# Patient Record
Sex: Female | Born: 2000
Health system: Southern US, Community
[De-identification: ages and names within clinical notes are randomized; demographics above are authoritative.]

## PROBLEM LIST (undated history)

## (undated) DIAGNOSIS — H6690 Otitis media, unspecified, unspecified ear: Secondary | ICD-10-CM

## (undated) DIAGNOSIS — G43909 Migraine, unspecified, not intractable, without status migrainosus: Secondary | ICD-10-CM

## (undated) DIAGNOSIS — F419 Anxiety disorder, unspecified: Secondary | ICD-10-CM

## (undated) HISTORY — PX: TYMPANOSTOMY TUBE PLACEMENT: SHX32

## (undated) HISTORY — DX: Migraine, unspecified, not intractable, without status migrainosus: G43.909

---
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
## 2020-01-27 NOTE — Progress Notes (Signed)
[facility]    Provider:  Dr Lucia Gaskins Requesting Provider: Morrell Riddle, PA-C Primary Care Provider:  Morrell Riddle, PA-C  CC:  Migraines  She is here with her mother, she had side effects to the Ajovy, mother provides most information, we discussed multiple options considering she is so sensitive to medications, we discussed Botox, we discussed Cefaly and some other devices, we discussed starting a little nortriptyline at night and transmagnetic stimulation.  We will start nortriptyline and transmagnetic stimulation.  HPI:  Teresa Burke is a 19 y.o. female here as requested by Teresa Burke, [name], PA-C for migraines.  She has a past medical history of ADD, autism, bipolar 2 disorder, generalized anxiety disorder, migraine. Mother here and provides a lot of information. She gets bad migraines every other day, it impacts her vision, she sees TV static, it has gotten a lot worse, started worsening at the start of this year when her grandfather passed away, hasn't gotten better. Starts on the left can spread, light and sound sensitivity, nausea, pulsating, pounding and throbbing. They can last up to 3 days, on average lasts more than 4 hours, sleeping helps. Loud noises can trigger. She used to get them only during menses, it has since progressed, advil works. At least 20 headache days a month, at least 10 are severe migraines. She does not ttolerate medications well. Father has migraines.She has vision changes, headaches can be in the morning, positional such as worse bending over or supine. No other focal neurologic deficits, associated symptoms, inciting events or modifiable factors. Mother provides most information.  Reviewed notes, labs and imaging from outside physicians, which showed:  Medications tried: Beta-blockers, gabapentin(possible side effects), Depakote (did not tolerate), cymbalta  I reviewed [name] notes.  Patient has vision changes, she sees static all  the time, she has an eye appointment next week, when she has a migraine he gets worse and then sometimes she gets spots but mostly after the headache is already started.  She gets these headaches 3 times a week, they last for 15 minutes to days, she will use Advil which helps with the pain but nothing helps with the vision.  The headache does not resolve but gets manageable after Advil.  Worse around her menses, more intense, last longer and vision is worse.  Sometimes she can go to sleep and wake up without them.  She gets nauseated, photophobia and phonophobia, she misses out on things due to these headaches.  She is had these headaches for life.  In middle school they got worse at menarche and they were definitely related to her cycles.  But they have gotten worse in the last year and now gets them at times not around her menses, some vision change with what she describes as imprint objects when she moves her visual fields she can still see the item she had just been looking at, the snow used to be still but now moves without eye movement, she wears glasses.  Cbc/cmp unremarkable  Review of Systems: Patient complains of symptoms per HPI as well as the following symptoms: headaches, vision changes. Pertinent negatives and positives per HPI. All others negative.   Social History   Socioeconomic History  . Marital status: Single    Spouse name: Not on file  . Number of children: Not on file  . Years of education: Not on file  . Highest education level: Not on file  Occupational History  . Occupation: Consulting civil engineer  Tobacco Use  .  Smoking status: Never Smoker  . Smokeless tobacco: Never Used  Substance and Sexual Activity  . Alcohol use: No  . Drug use: No  . Sexual activity: Never  Other Topics Concern  . Not on file  Social History Narrative   Lives with mom and step dad   Clinical biochemist   Caffeine: none   Social Determinants of Health   Financial Resource  Strain:   . Difficulty of Paying Living Expenses:   Food Insecurity:   . Worried About Charity fundraiser in the Last Year:   . Arboriculturist in the Last Year:   Transportation Needs:   . Film/video editor (Medical):   Marland Kitchen Lack of Transportation (Non-Medical):   Physical Activity:   . Days of Exercise per Week:   . Minutes of Exercise per Session:   Stress:   . Feeling of Stress :   Social Connections:   . Frequency of Communication with Friends and Family:   . Frequency of Social Gatherings with Friends and Family:   . Attends Religious Services:   . Active Member of Clubs or Organizations:   . Attends Archivist Meetings:   Marland Kitchen Marital Status:   Intimate Partner Violence:   . Fear of Current or Ex-Partner:   . Emotionally Abused:   Marland Kitchen Physically Abused:   . Sexually Abused:     Family History  Problem Relation Age of Onset  . Migraines Mother   . Depression Mother   . Anxiety disorder Mother   . Migraines Father   . Anxiety disorder Father   . Depression Father     Past Medical History:  Diagnosis Date  . Anxiety   . Migraine   . Otitis     Patient Active Problem List   Diagnosis Date Noted  . Autism 08/04/2017  . ADD (attention deficit disorder) 08/04/2017  . Visual disturbances 01/20/2015  . Migraine without aura and without status migrainosus, not intractable 01/20/2015  . Generalized anxiety disorder 01/20/2015  . Bipolar 2 disorder ([place]) 01/20/2015    Past Surgical History:  Procedure Laterality Date  . TYMPANOSTOMY TUBE PLACEMENT      Current Outpatient Medications  Medication Sig Dispense Refill  . loratadine (CLARITIN) 10 MG tablet Take 10 mg by mouth daily.    Marland Kitchen VITAMIN D PO Take 5,000 Int'l Units by mouth daily.    . nortriptyline (PAMELOR) 10 MG capsule Take 1 capsule (10 mg total) by mouth at bedtime. 30 capsule 3   No current facility-administered medications for this visit.    Allergies as of 01/29/2020 - Review Complete  01/29/2020  Allergen Reaction Noted  . Mosquito (culex pipiens) allergy skin test Anaphylaxis 11/28/2019  . Fremanezumab-vfrm Hives 11/25/2019  . Other  01/20/2015    Vitals: BP 133/86 (BP Location: Right Arm, Patient Position: Sitting) Comment: pt nervous  Pulse 99   Temp (!) 97.5 F (36.4 C) Comment: taken at front; mother 97.4  Ht 5\' 3"  (1.6 m)   Wt 109 lb (49.4 kg)   BMI 19.31 kg/m  Last Weight:  Wt Readings from Last 1 Encounters:  01/29/20 109 lb (49.4 kg) (16 %, Z= -0.98)*   * Growth percentiles are based on CDC (Girls, 2-20 Years) data.   Last Height:   Ht Readings from Last 1 Encounters:  01/29/20 5\' 3"  (1.6 m) (31 %, Z= -0.49)*   * Growth percentiles are based on CDC (Girls, 2-20 Years) data.  Physical exam: Exam: Gen: flat affect, conversant, well nourised               CV: RRR, no MRG. No Carotid Bruits. No peripheral edema, warm, nontender Eyes: Conjunctivae clear without exudates or hemorrhage  Neuro: Detailed Neurologic Exam  Speech:    Speech is normal; fluent and spontaneous with normal comprehension.  Cognition:    The patient is oriented to person, place, and time;     recent and remote memory intact;   Cranial Nerves:    The pupils are equal, round, and reactive to light.Visual fields are full to finger confrontation. Extraocular movements are intact. Trigeminal sensation is intact and the muscles of mastication are normal. The face is symmetric. The palate elevates in the midline. Hearing intact. Voice is normal. Shoulder shrug is normal. The tongue has normal motion without fasciculations.  Gait:    Normal native gait  Motor Observation:    No asymmetry, no atrophy, and no involuntary movements noted. Tone:    Normal muscle tone.    Posture:    Posture is normal. normal erect    Strength:    Strength is V/V in the upper and lower limbs.       Assessment/Plan: 19 year old with chronic migraines, autistic, mother provides most  information. She is extremely sensitive to medications, Ajovy helped but she had side effects.  - Nortriptyline 10mg  at bedtime:  - TMS [facility] on [address] for refractory depression and chronic migraines: we will call and see what the referral protocol process entails - If Nortriptyline has side effects, try imipramine - Could also try topamax 25mg  and cut in half - MRI brain was normal   No orders of the defined types were placed in this encounter.  Meds ordered this encounter  Medications  . nortriptyline (PAMELOR) 10 MG capsule    Sig: Take 1 capsule (10 mg total) by mouth at bedtime.    Dispense:  30 capsule    Refill:  3    Cc:  , PA-C  , MD  [facility] [address] [place], [address] [place]  Phone [phone] Fax [phone]  I spent 30 minutes of face-to-face and non-face-to-face time with patient on the  1. Chronic migraine w/o aura w/o status migrainosus, not intractable    diagnosis.  This included previsit chart review, lab review, study review, order entry, electronic health record documentation, patient education on the different diagnostic and therapeutic options, counseling and coordination of care, risks and benefits of management, compliance, or risk factor reduction

## 2020-01-29 ENCOUNTER — Other Ambulatory Visit: Payer: Self-pay

## 2020-01-29 ENCOUNTER — Encounter: Payer: Self-pay | Admitting: Neurology

## 2020-01-29 ENCOUNTER — Ambulatory Visit (INDEPENDENT_AMBULATORY_CARE_PROVIDER_SITE_OTHER): Payer: BC Managed Care – PPO | Admitting: Neurology

## 2020-01-29 VITALS — BP 133/86 | HR 99 | Temp 97.5°F | Ht 63.0 in | Wt 109.0 lb

## 2020-01-29 DIAGNOSIS — G43709 Chronic migraine without aura, not intractable, without status migrainosus: Secondary | ICD-10-CM

## 2020-01-29 MED ORDER — NORTRIPTYLINE HCL 10 MG PO CAPS: 10.0000 mg | ORAL_CAPSULE | Freq: Every day | ORAL | 3 refills | Status: DC

## 2020-01-29 NOTE — Patient Instructions (Addendum)
Start Nortriptyline at bedtime for prevention TMS therapy - will call then Maxalt/Rizatriptan: Please take one tablet at the onset of your headache. If it does not improve the symptoms please take one additional tablet. Do not take more then 2 tablets in 24hrs. Do not take use more then 2 to 3 times in a week.  Rizatriptan disintegrating tablets What is this medicine? RIZATRIPTAN (rye za TRIP tan) is used to treat migraines with or without aura. An aura is a strange feeling or visual disturbance that warns you of an attack. It is not used to prevent migraines. This medicine may be used for other purposes; ask your health care provider or pharmacist if you have questions. COMMON BRAND NAME(S): Maxalt-MLT What should I tell my health care provider before I take this medicine? They need to know if you have any of these conditions:  cigarette smoker  circulation problems in fingers and toes  diabetes  heart disease  high blood pressure  high cholesterol  history of irregular heartbeat  history of stroke  kidney disease  liver disease  stomach or intestine problems  an unusual or allergic reaction to rizatriptan, other medicines, foods, dyes, or preservatives  pregnant or trying to get pregnant  breast-feeding How should I use this medicine? Take this medicine by mouth. Follow the directions on the prescription label. Leave the tablet in the sealed blister pack until you are ready to take it. With dry hands, open the blister and gently remove the tablet. If the tablet breaks or crumbles, throw it away and take a new tablet out of the blister pack. Place the tablet in the mouth and allow it to dissolve, and then swallow. Do not cut, crush, or chew this medicine. You do not need water to take this medicine. Do not take it more often than directed. Talk to your pediatrician regarding the use of this medicine in children. While this drug may be prescribed for children as young as 6  years for selected conditions, precautions do apply. Overdosage: If you think you have taken too much of this medicine contact a poison control center or emergency room at once. NOTE: This medicine is only for you. Do not share this medicine with others. What if I miss a dose? This does not apply. This medicine is not for regular use. What may interact with this medicine? Do not take this medicine with any of the following medicines:  certain medicines for migraine headache like almotriptan, eletriptan, frovatriptan, naratriptan, rizatriptan, sumatriptan, zolmitriptan  ergot alkaloids like dihydroergotamine, ergonovine, ergotamine, methylergonovine  MAOIs like Carbex, Eldepryl, Marplan, Nardil, and Parnate This medicine may also interact with the following medications:  certain medicines for depression, anxiety, or psychotic disorders  propranolol This list may not describe all possible interactions. Give your health care provider a list of all the medicines, herbs, non-prescription drugs, or dietary supplements you use. Also tell them if you smoke, drink alcohol, or use illegal drugs. Some items may interact with your medicine. What should I watch for while using this medicine? Visit your healthcare professional for regular checks on your progress. Tell your healthcare professional if your symptoms do not start to get better or if they get worse. You may get drowsy or dizzy. Do not drive, use machinery, or do anything that needs mental alertness until you know how this medicine affects you. Do not stand up or sit up quickly, especially if you are an older patient. This reduces the risk of dizzy or fainting  spells. Alcohol may interfere with the effect of this medicine. Your mouth may get dry. Chewing sugarless gum or sucking hard candy and drinking plenty of water may help. Contact your healthcare professional if the problem does not go away or is severe. If you take migraine medicines for 10  or more days a month, your migraines may get worse. Keep a diary of headache days and medicine use. Contact your healthcare professional if your migraine attacks occur more frequently. What side effects may I notice from receiving this medicine? Side effects that you should report to your doctor or health care professional as soon as possible:  allergic reactions like skin rash, itching or hives, swelling of the face, lips, or tongue  chest pain or chest tightness  signs and symptoms of a dangerous change in heartbeat or heart rhythm like chest pain; dizziness; fast, irregular heartbeat; palpitations; feeling faint or lightheaded; falls; breathing problems  signs and symptoms of a stroke like changes in vision; confusion; trouble speaking or understanding; severe headaches; sudden numbness or weakness of the face, arm or leg; trouble walking; dizziness; loss of balance or coordination  signs and symptoms of serotonin syndrome like irritable; confusion; diarrhea; fast or irregular heartbeat; muscle twitching; stiff muscles; trouble walking; sweating; high fever; seizures; chills; vomiting Side effects that usually do not require medical attention (report to your doctor or health care professional if they continue or are bothersome):  diarrhea  dizziness  drowsiness  dry mouth  headache  nausea, vomiting  pain, tingling, numbness in the hands or feet  stomach pain This list may not describe all possible side effects. Call your doctor for medical advice about side effects. You may report side effects to FDA at 1-800-FDA-1088. Where should I keep my medicine? Keep out of the reach of children. Store at room temperature between 15 and 30 degrees C (59 and 86 degrees F). Protect from light and moisture. Throw away any unused medicine after the expiration date. NOTE: This sheet is a summary. It may not cover all possible information. If you have questions about this medicine, talk to your  doctor, pharmacist, or health care provider.  2020 Elsevier/Gold Standard (2018-05-07 14:58:08)   Nortriptyline capsules What is this medicine? NORTRIPTYLINE (nor TRIP ti leen) is used to treat depression  This medicine may be used for other purposes; ask your health care provider or pharmacist if you have questions. COMMON BRAND NAME(S): Aventyl, Pamelor What should I tell my health care provider before I take this medicine? They need to know if you have any of these conditions:  bipolar disorder  Brugada syndrome  difficulty passing urine  glaucoma  heart disease  if you drink alcohol  liver disease  schizophrenia  seizures  suicidal thoughts, plans or attempt; a previous suicide attempt by you or a family member  thyroid disease  an unusual or allergic reaction to nortriptyline, other tricyclic antidepressants, other medicines, foods, dyes, or preservatives  pregnant or trying to get pregnant  breast-feeding How should I use this medicine? Take this medicine by mouth with a glass of water. Follow the directions on the prescription label. Take your doses at regular intervals. Do not take it more often than directed. Do not stop taking this medicine suddenly except upon the advice of your doctor. Stopping this medicine too quickly may cause serious side effects or your condition may worsen. A special MedGuide will be given to you by the pharmacist with each prescription and refill. Be sure to read  this information carefully each time. Talk to your pediatrician regarding the use of this medicine in children. Special care may be needed. Overdosage: If you think you have taken too much of this medicine contact a poison control center or emergency room at once. NOTE: This medicine is only for you. Do not share this medicine with others. What if I miss a dose? If you miss a dose, take it as soon as you can. If it is almost time for your next dose, take only that dose. Do  not take double or extra doses. What may interact with this medicine? Do not take this medicine with any of the following medications:  cisapride  dronedarone  linezolid  MAOIs like Carbex, Eldepryl, Marplan, Nardil, and Parnate  methylene blue (injected into a vein)  pimozide  thioridazine This medicine may also interact with the following medications:  alcohol  antihistamines for allergy, cough, and cold  atropine  certain medicines for bladder problems like oxybutynin, tolterodine  certain medicines for depression like amitriptyline, fluoxetine, sertraline  certain medicines for Parkinson's disease like benztropine, trihexyphenidyl  certain medicines for stomach problems like dicyclomine, hyoscyamine  certain medicines for travel sickness like scopolamine  chlorpropamide  cimetidine  ipratropium  other medicines that prolong the QT interval (an abnormal heart rhythm) like dofetilide  other medicines that can cause serotonin syndrome like St. John's Wort, fentanyl, lithium, tramadol, tryptophan, buspirone, and some medicines for headaches like sumatriptan or rizatriptan  quinidine  reserpine  thyroid medicine This list may not describe all possible interactions. Give your health care provider a list of all the medicines, herbs, non-prescription drugs, or dietary supplements you use. Also tell them if you smoke, drink alcohol, or use illegal drugs. Some items may interact with your medicine. What should I watch for while using this medicine? Tell your doctor if your symptoms do not get better or if they get worse. Visit your doctor or health care professional for regular checks on your progress. Because it may take several weeks to see the full effects of this medicine, it is important to continue your treatment as prescribed by your doctor. Patients and their families should watch out for new or worsening thoughts of suicide or depression. Also watch out for  sudden changes in feelings such as feeling anxious, agitated, panicky, irritable, hostile, aggressive, impulsive, severely restless, overly excited and hyperactive, or not being able to sleep. If this happens, especially at the beginning of treatment or after a change in dose, call your health care professional. Bonita Quin may get drowsy or dizzy. Do not drive, use machinery, or do anything that needs mental alertness until you know how this medicine affects you. Do not stand or sit up quickly, especially if you are an older patient. This reduces the risk of dizzy or fainting spells. Alcohol may interfere with the effect of this medicine. Avoid alcoholic drinks. Do not treat yourself for coughs, colds, or allergies without asking your doctor or health care professional for advice. Some ingredients can increase possible side effects. Your mouth may get dry. Chewing sugarless gum or sucking hard candy, and drinking plenty of water may help. Contact your doctor if the problem does not go away or is severe. This medicine may cause dry eyes and blurred vision. If you wear contact lenses you may feel some discomfort. Lubricating drops may help. See your eye doctor if the problem does not go away or is severe. This medicine can cause constipation. Try to have a bowel movement  at least every 2 to 3 days. If you do not have a bowel movement for 3 days, call your doctor or health care professional. This medicine can make you more sensitive to the sun. Keep out of the sun. If you cannot avoid being in the sun, wear protective clothing and use sunscreen. Do not use sun lamps or tanning beds/booths. What side effects may I notice from receiving this medicine? Side effects that you should report to your doctor or health care professional as soon as possible:  allergic reactions like skin rash, itching or hives, swelling of the face, lips, or tongue  anxious  breathing problems  changes in vision  confusion  elevated  mood, decreased need for sleep, racing thoughts, impulsive behavior  eye pain  fast, irregular heartbeat  feeling faint or lightheaded, falls  feeling agitated, angry, or irritable  fever with increased sweating  hallucination, loss of contact with reality  seizures  stiff muscles  suicidal thoughts or other mood changes  tingling, pain, or numbness in the feet or hands  trouble passing urine or change in the amount of urine  trouble sleeping  unusually weak or tired  vomiting  yellowing of the eyes or skin Side effects that usually do not require medical attention (report to your doctor or health care professional if they continue or are bothersome):  change in sex drive or performance  change in appetite or weight  constipation  dizziness  dry mouth  nausea  tired  tremors  upset stomach This list may not describe all possible side effects. Call your doctor for medical advice about side effects. You may report side effects to FDA at 1-800-FDA-1088. Where should I keep my medicine? Keep out of the reach of children. Store at room temperature between 15 and 30 degrees C (59 and 86 degrees F). Keep container tightly closed. Throw away any unused medicine after the expiration date. NOTE: This sheet is a summary. It may not cover all possible information. If you have questions about this medicine, talk to your doctor, pharmacist, or health care provider.  2020 Elsevier/Gold Standard (2018-10-15 13:24:58)

## 2020-02-02 ENCOUNTER — Telehealth: Payer: Self-pay | Admitting: Neurology

## 2020-02-02 NOTE — Telephone Encounter (Signed)
Teresa Burke, I would like to send patient to Trans magnetic therapy(TMS) at [facility] on [address] for refractory depression and chronic migraines. Would you give them a call please and see what the process for referral is? Thank you !!

## 2020-02-04 NOTE — Telephone Encounter (Signed)
I have contacted office on [place], I would like to send patient to Trans magnetic therapy(TMS) at [facility] on [address] for refractory depression and chronic migraines .  There office relayed to me I had to fill out referral on line and they would call the patient. Process has been completed.

## 2020-02-06 MED ORDER — RIZATRIPTAN BENZOATE 10 MG PO TBDP: 10.0000 mg | ORAL_TABLET | ORAL | 11 refills | Status: DC | PRN

## 2020-02-06 NOTE — Telephone Encounter (Signed)
Meds ordered this encounter  Medications  . rizatriptan (MAXALT-MLT) 10 MG disintegrating tablet    Sig: Take 1 tablet (10 mg total) by mouth as needed for migraine. May repeat in 2 hours if needed    Dispense:  9 tablet    Refill:  11    Suanne Marker, MD 02/06/2020, 12:44 PM Certified in Neurology, Neurophysiology and Neuroimaging  [facility] [address], [address] [place], [place] [address] [phone]

## 2020-02-21 ENCOUNTER — Other Ambulatory Visit: Payer: Self-pay | Admitting: Neurology

## 2020-05-05 ENCOUNTER — Telehealth: Payer: BC Managed Care – PPO | Admitting: Neurology

## 2020-07-04 NOTE — Progress Notes (Signed)
[facility]    Provider:  Dr Teresa Burke Requesting Provider: Morrell Riddle, Burke Primary Care Provider:  Morrell Riddle, Burke  CC:  Migraines  Virtual Visit via Video Note  I connected with Teresa Burke on 07/05/20 and her mother at  1:00 PM EDT by a video enabled telemedicine application and verified that I am speaking with the correct person using two identifiers.  Location: Patient: Home Provider: office   I discussed the limitations of evaluation and management by telemedicine and the availability of in person appointments. The patient expressed understanding and agreed to proceed.  Follow Up Instructions:    I discussed the assessment and treatment plan with the patient. The patient was provided an opportunity to ask questions and all were answered. The patient agreed with the plan and demonstrated an understanding of the instructions.   The patient was advised to call back or seek an in-person evaluation if the symptoms worsen or if the condition fails to improve as anticipated.  I provided more than 25 minutes of video face-to-face and non-face-to-face time during this encounter.   Teresa Fret, MD    Interval history July 04, 2020: Patient is here for follow-up, usually here with her mother, she had side effects to Ajovy, we tried her on Topamax, TMS therapy and nortriptyline.  She was also given rizatriptan which she found very helpful.  However she was unable to continue taking nortriptyline due to side effects and she was receiving TMS treatment at [facility].  She seems to have side effects to every medication we try.  May consider Cefaly or biofeedback.  Since the rizatriptan help maybe she can use that acutely.  She finished the TMS and started feeling better then started having a lot of physical issues and stomach issues and did reduce her migraines. Mother is on the call. They said they wanted to wait until she had her physical  issues addressed. But they are waiting to see if she was improved, migraines a lot less frequent, she is having 8 migraine/headache days and improved in severity, most of the pain is headaches. Mother says she is feeling "bad physically" but headaches/migraines are better. They are mild. Rizatriptan did not help. Will try Naratriptan.   02/2020: She is here with her mother, she had side effects to the Ajovy, mother provides most information, we discussed multiple options considering she is so sensitive to medications, we discussed Botox, we discussed Cefaly and some other devices, we discussed starting a little nortriptyline at night and transmagnetic stimulation.  We will start nortriptyline and transmagnetic stimulation.  HPI:  Teresa Burke is a 19 y.o. female here as requested by Teresa Burke for migraines.  She has a past medical history of ADD, autism, bipolar 2 disorder, generalized anxiety disorder, migraine. Mother here and provides a lot of information. She gets bad migraines every other day, it impacts her vision, she sees TV static, it has gotten a lot worse, started worsening at the start of this year when her grandfather passed away, hasn't gotten better. Starts on the left can spread, light and sound sensitivity, nausea, pulsating, pounding and throbbing. They can last up to 3 days, on average lasts more than 4 hours, sleeping helps. Loud noises can trigger. She used to get them only during menses, it has since progressed, advil works. At least 20 headache days a month, at least 10 are severe migraines. She does not ttolerate medications well. Father has migraines.She  has vision changes, headaches can be in the morning, positional such as worse bending over or supine. No other focal neurologic deficits, associated symptoms, inciting events or modifiable factors. Mother provides most information.  Reviewed notes, labs and imaging from outside physicians, which showed:  Medications  tried: Beta-blockers, gabapentin(possible side effects), Depakote (did not tolerate), cymbalta  I reviewed Teresa Burke's notes.  Patient has vision changes, she sees static all the time, she has an eye appointment next week, when she has a migraine he gets worse and then sometimes she gets spots but mostly after the headache is already started.  She gets these headaches 3 times a week, they last for 15 minutes to days, she will use Advil which helps with the pain but nothing helps with the vision.  The headache does not resolve but gets manageable after Advil.  Worse around her menses, more intense, last longer and vision is worse.  Sometimes she can go to sleep and wake up without them.  She gets nauseated, photophobia and phonophobia, she misses out on things due to these headaches.  She is had these headaches for life.  In middle school they got worse at menarche and they were definitely related to her cycles.  But they have gotten worse in the last year and now gets them at times not around her menses, some vision change with what she describes as imprint objects when she moves her visual fields she can still see the item she had just been looking at, the snow used to be still but now moves without eye movement, she wears glasses.  Cbc/cmp unremarkable  Review of Systems: Patient complains of symptoms per HPI as well as the following symptoms: headaches, abdominal pain. Pertinent negatives and positives per HPI. All others negative.   Social History   Socioeconomic History  . Marital status: Single    Spouse name: Not on file  . Number of children: Not on file  . Years of education: Not on file  . Highest education level: Not on file  Occupational History  . Occupation: Consulting civil engineerstudent  Tobacco Use  . Smoking status: Never Smoker  . Smokeless tobacco: Never Used  Vaping Use  . Vaping Use: Never used  Substance and Sexual Activity  . Alcohol use: No  . Drug use: No  . Sexual activity: Never   Other Topics Concern  . Not on file  Social History Narrative   Lives with mom and step dad   [employer]   Private tutor   Caffeine: none   Social Determinants of Health   Financial Resource Strain:   . Difficulty of Paying Living Expenses: Not on file  Food Insecurity:   . Worried About Programme researcher, broadcasting/film/videounning Out of Food in the Last Year: Not on file  . Ran Out of Food in the Last Year: Not on file  Transportation Needs:   . Lack of Transportation (Medical): Not on file  . Lack of Transportation (Non-Medical): Not on file  Physical Activity:   . Days of Exercise per Week: Not on file  . Minutes of Exercise per Session: Not on file  Stress:   . Feeling of Stress : Not on file  Social Connections:   . Frequency of Communication with Friends and Family: Not on file  . Frequency of Social Gatherings with Friends and Family: Not on file  . Attends Religious Services: Not on file  . Active Member of Clubs or Organizations: Not on file  . Attends Club  or Organization Meetings: Not on file  . Marital Status: Not on file  Intimate Partner Violence:   . Fear of Current or Ex-Partner: Not on file  . Emotionally Abused: Not on file  . Physically Abused: Not on file  . Sexually Abused: Not on file    Family History  Problem Relation Age of Onset  . Migraines Mother   . Depression Mother   . Anxiety disorder Mother   . Migraines Father   . Anxiety disorder Father   . Depression Father     Past Medical History:  Diagnosis Date  . Anxiety   . Migraine   . Otitis     Patient Active Problem List   Diagnosis Date Noted  . Autism 08/04/2017  . ADD (attention deficit disorder) 08/04/2017  . Visual disturbances 01/20/2015  . Migraine without aura and without status migrainosus, not intractable 01/20/2015  . Generalized anxiety disorder 01/20/2015  . Bipolar 2 disorder (HCC) 01/20/2015    Past Surgical History:  Procedure Laterality Date  . TYMPANOSTOMY TUBE PLACEMENT       Current Outpatient Medications  Medication Sig Dispense Refill  . loratadine (CLARITIN) 10 MG tablet Take 10 mg by mouth daily.    . naratriptan (AMERGE) 2.5 MG tablet Take 1 tablet (2.5 mg total) by mouth as needed for migraine. Take one (1) tablet at onset of headache; if returns or does not resolve, may repeat after 4 hours; do not exceed five (5) mg in 24 hours. 9 tablet 6  . nortriptyline (PAMELOR) 10 MG capsule TAKE 1 CAPSULE (10 MG TOTAL) BY MOUTH AT BEDTIME. 90 capsule 2  . VITAMIN D PO Take 5,000 Int'l Units by mouth daily.     No current facility-administered medications for this visit.    Allergies as of 07/05/2020 - Review Complete 01/29/2020  Allergen Reaction Noted  . Mosquito (culex pipiens) allergy skin test Anaphylaxis 11/28/2019  . Fremanezumab-vfrm Hives 11/25/2019  . Other  01/20/2015    Vitals: There were no vitals taken for this visit. Last Weight:  Wt Readings from Last 1 Encounters:  01/29/20 109 lb (49.4 kg) (16 %, Z= -0.98)*   * Growth percentiles are based on CDC (Girls, 2-20 Years) data.   Last Height:   Ht Readings from Last 1 Encounters:  01/29/20 5\' 3"  (1.6 m) (31 %, Z= -0.49)*   * Growth percentiles are based on CDC (Girls, 2-20 Years) data.    Physical exam: Exam:    Physical exam: Exam: Gen: NAD, conversant      CV: attempted, Could not perform over Web Video. Denies palpitations or chest pain or SOB. VS: Breathing at a normal rate. Weight appears within normal limits. Not febrile. Eyes: Conjunctivae clear without exudates or hemorrhage  Neuro: Detailed Neurologic Exam  Speech:    Speech is normal; fluent and spontaneous with normal comprehension.  Cognition:    The patient is oriented to person, place, and time;     recent and remote memory intact;     language fluent;     normal attention, concentration,     fund of knowledge Cranial Nerves:    The pupils are equal, round, and reactive to light. Attempted, Cannot  perform fundoscopic exam. Visual fields are full to finger confrontation. Extraocular movements are intact.  The face is symmetric with normal sensation. The palate elevates in the midline. Hearing intact. Voice is normal. Shoulder shrug is normal. The tongue has normal motion without fasciculations.   Coordination:  Normal finger to nose  Gait:    Normal native gait  Motor Observation:   no involuntary movements noted. Tone:    Appears normal  Posture:    Posture is normal. normal erect    Strength:    Strength is anti-gravity and symmetric in the upper and lower limbs.      Sensation: intact to LT     Reflex Exam:  DTR's:    Attempted, Could not perform over Web Video   Toes: Attempted Could not perform over Web Video  Clonus:   Attempted, Could not perform over Web Video      Assessment/Plan: 19 year old with chronic migraines, autistic, mother provides most information. She is extremely sensitive to medications, Ajovy helped but she had side effects.  - Nortriptyline 10mg  at bedtime: did not tolerate - TMS: helped significantly with headache, now milder and 2x aweek, more headaches than migraines, will repeat TMS in the future if needed, acute management for now for mild headache ibuprofen, for moderate to severe headache or migraine amerge and ibuprofen - TMS [place] on [address] for refractory depression and chronic migraines: migraines improved - MRI brain was normal  Meds ordered this encounter  Medications  . naratriptan (AMERGE) 2.5 MG tablet    Sig: Take 1 tablet (2.5 mg total) by mouth as needed for migraine. Take one (1) tablet at onset of headache; if returns or does not resolve, may repeat after 4 hours; do not exceed five (5) mg in 24 hours.    Dispense:  9 tablet    Refill:  6    Cc:  , Teresa Cones, Burke  Teresa Severin, MD  [facility] [address] [place], [place]  Phone [phone] Fax  [phone]

## 2020-07-05 ENCOUNTER — Encounter: Payer: Self-pay | Admitting: Neurology

## 2020-07-05 ENCOUNTER — Telehealth (INDEPENDENT_AMBULATORY_CARE_PROVIDER_SITE_OTHER): Payer: BC Managed Care – PPO | Admitting: Neurology

## 2020-07-05 ENCOUNTER — Telehealth: Payer: Self-pay | Admitting: Neurology

## 2020-07-05 DIAGNOSIS — G43709 Chronic migraine without aura, not intractable, without status migrainosus: Secondary | ICD-10-CM

## 2020-07-05 MED ORDER — NARATRIPTAN HCL 2.5 MG PO TABS: 2.5000 mg | ORAL_TABLET | ORAL | 6 refills | Status: AC | PRN

## 2020-07-05 NOTE — Patient Instructions (Addendum)
Migraines improved. Can consider TMS in the future  - TMS: helped significantly with headache, now milder and 2x aweek, more headaches than migraines, will repeat TMS in the future if needed, acute management for now for mild headache ibuprofen, for moderate to severe headache or migraine amerge (Naratriptan) and ibuprofen  -Stop Rizatriptan

## 2020-07-05 NOTE — Telephone Encounter (Signed)
Delorise Jackson, would you mind calling patient and setting up a 5-month video appointment with me please? thanks

## 2020-07-06 ENCOUNTER — Other Ambulatory Visit (HOSPITAL_COMMUNITY): Payer: Self-pay | Admitting: Gastroenterology

## 2020-07-06 ENCOUNTER — Other Ambulatory Visit: Payer: Self-pay | Admitting: Gastroenterology

## 2020-07-06 DIAGNOSIS — R1032 Left lower quadrant pain: Secondary | ICD-10-CM

## 2020-07-07 ENCOUNTER — Other Ambulatory Visit: Payer: Self-pay

## 2020-07-07 ENCOUNTER — Ambulatory Visit (HOSPITAL_COMMUNITY)
Admission: RE | Admit: 2020-07-07 | Discharge: 2020-07-07 | Disposition: A | Discharge status: Home / Self Care | Payer: BC Managed Care – PPO | Source: Ambulatory Visit | Attending: Gastroenterology | Admitting: Gastroenterology

## 2020-07-07 ENCOUNTER — Encounter (HOSPITAL_COMMUNITY): Payer: Self-pay

## 2020-07-07 DIAGNOSIS — R1032 Left lower quadrant pain: Secondary | ICD-10-CM | POA: Insufficient documentation

## 2020-07-07 MED ORDER — IOHEXOL 9 MG/ML PO SOLN
1000.0000 mL | ORAL | Status: AC
  Administered 2020-07-07: 1000 mL via ORAL

## 2020-07-07 MED ORDER — IOHEXOL 300 MG/ML  SOLN
75.0000 mL | Freq: Once | INTRAMUSCULAR | Status: AC | PRN
  Administered 2020-07-07: 75 mL via INTRAVENOUS

## 2020-07-07 MED ORDER — IOHEXOL 9 MG/ML PO SOLN
ORAL | Status: AC
  Filled 2020-07-07: qty 1000

## 2020-11-08 ENCOUNTER — Telehealth: Payer: BC Managed Care – PPO | Admitting: Neurology

## 2020-12-28 DIAGNOSIS — Z0271 Encounter for disability determination: Secondary | ICD-10-CM

## 2021-02-08 ENCOUNTER — Telehealth: Payer: Self-pay | Admitting: Neurology

## 2021-02-08 NOTE — Telephone Encounter (Signed)
noted 

## 2021-02-08 NOTE — Telephone Encounter (Signed)
Pt's mother, Bobbye Riggs called, her social anxiety has peaked. There is been no chance with her migraines. We will continue to right now to work with Ricka Burdock. Have cancelled her appt.

## 2021-02-09 ENCOUNTER — Telehealth: Payer: Self-pay | Admitting: Neurology

## 2021-04-09 IMAGING — CT CT ABD-PELV W/ CM
2 of 4 series · 16 of 46 positions shown, 18 images · IV contrast (omnipaque)
Comparison: None.

CLINICAL DATA: Left abdominal pain with nausea, diarrhea, and
weight loss

EXAM:
CT ABDOMEN AND PELVIS WITH CONTRAST
TECHNIQUE: Multidetector CT imaging of the abdomen and pelvis was performed
using the standard protocol following bolus administration of
intravenous contrast.
CONTRAST:  75mL OMNIPAQUE IOHEXOL 300 MG/ML  SOLN

[Series 2: axial st · axial · 0.60mm/px · z∈[-755,-370]mm · 13 of 87 slices shown, 15 images]
[im 5/87  soft-tissue]
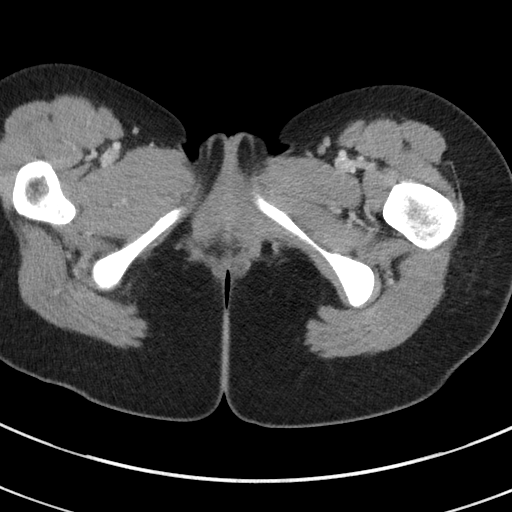
[im 5/87  bone]
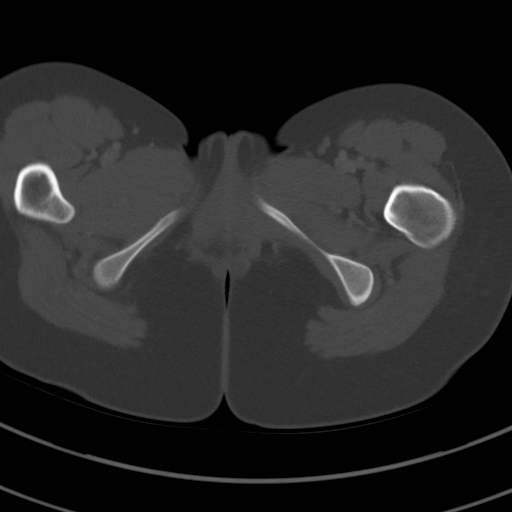
[im 10/87  soft-tissue]
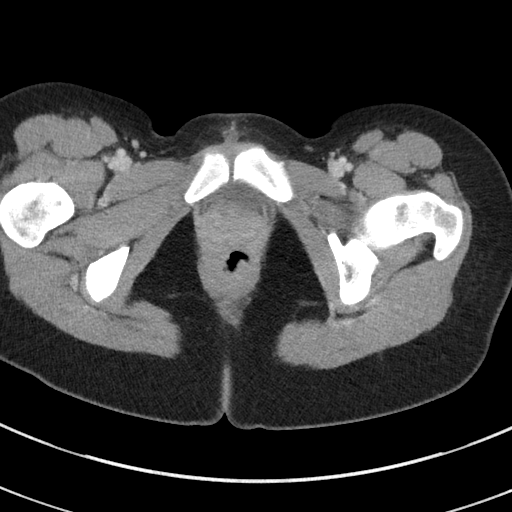
[im 20/87  soft-tissue]
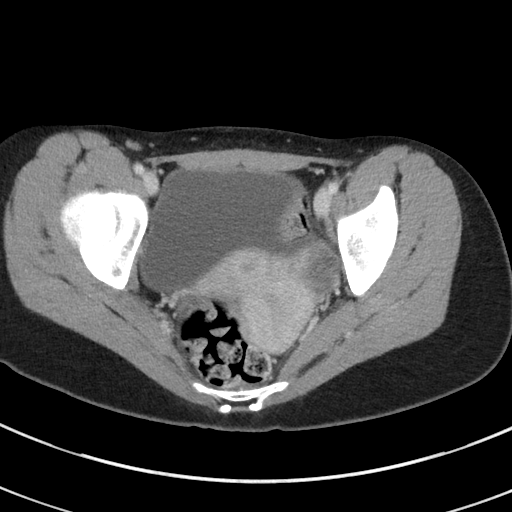
[im 24/87  soft-tissue]
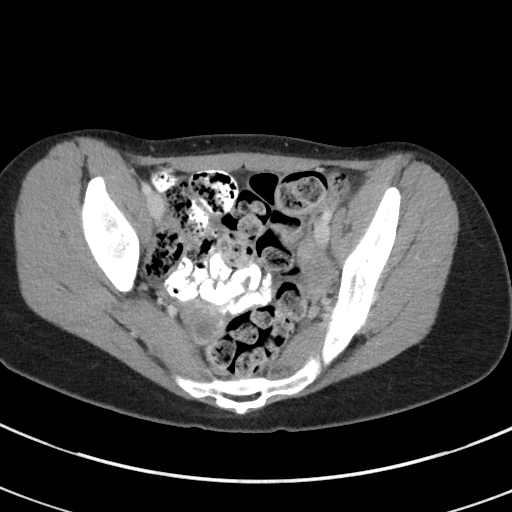
[im 29/87  soft-tissue]
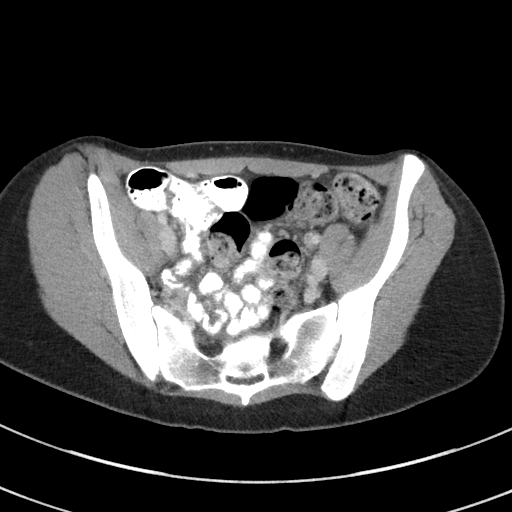
[im 39/87  soft-tissue]
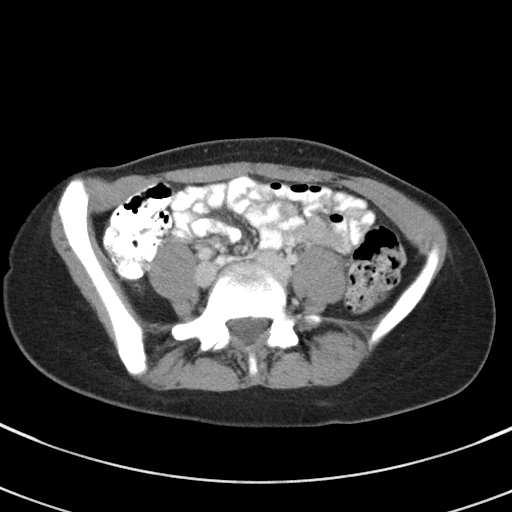
[im 44/87  soft-tissue]
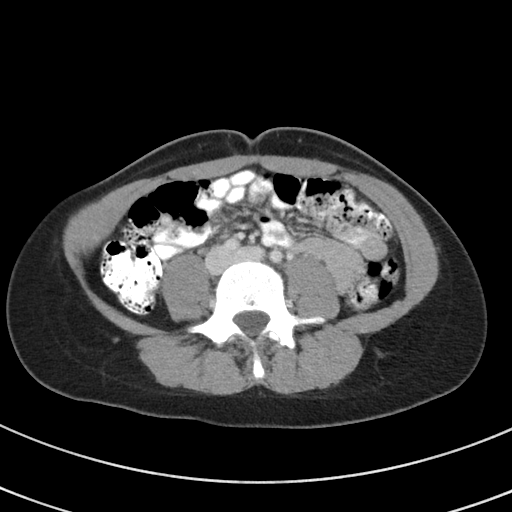
[im 48/87  soft-tissue]
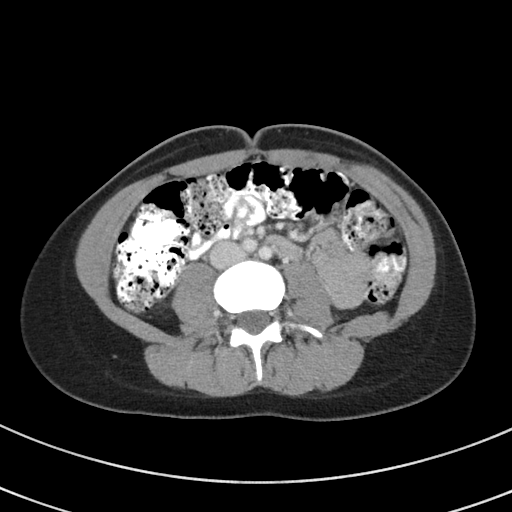
[im 58/87  soft-tissue]
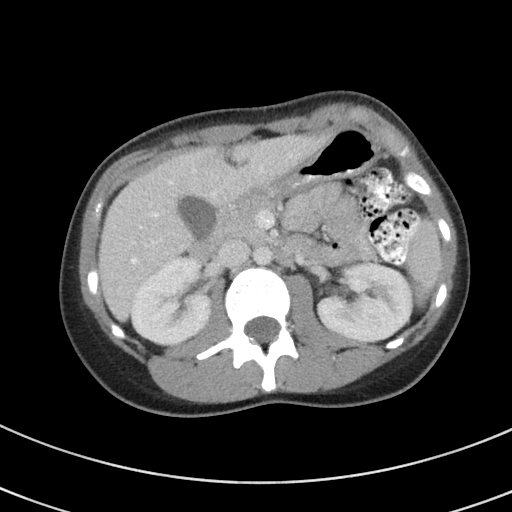
[im 58/87  bone]
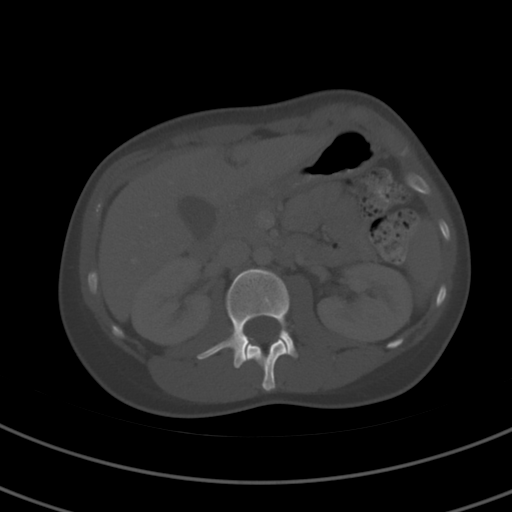
[im 63/87  soft-tissue]
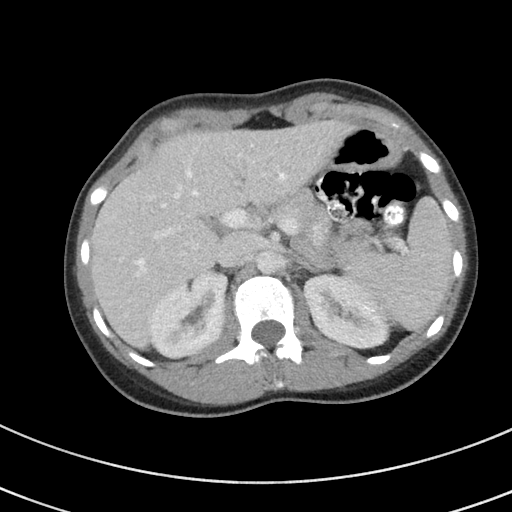
[im 67/87  soft-tissue]
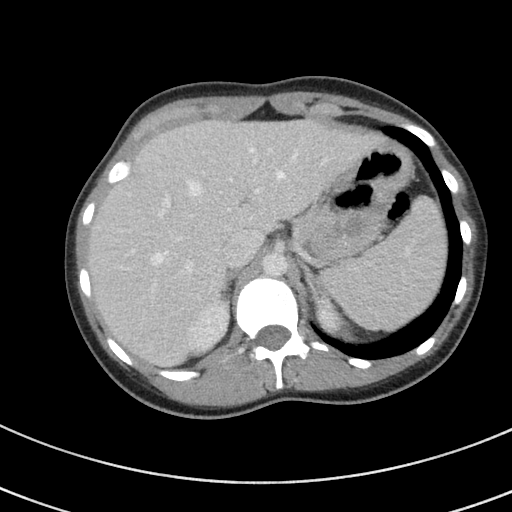
[im 77/87  soft-tissue]
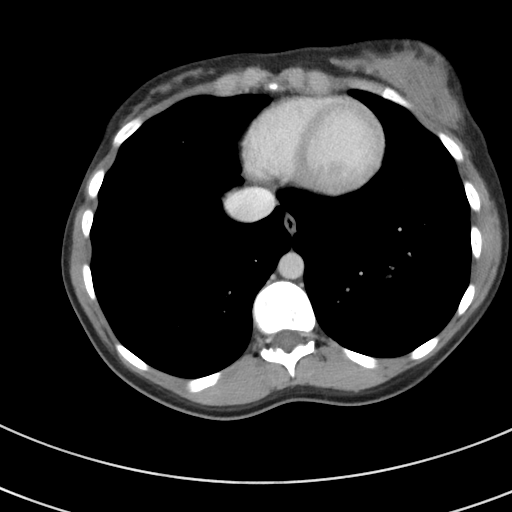
[im 82/87  soft-tissue]
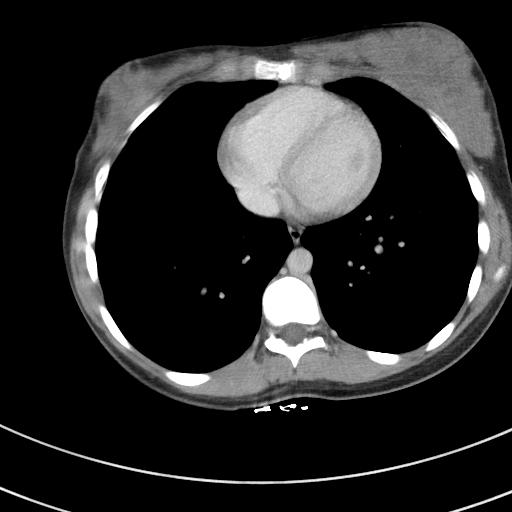

[Series 4: coronal st · coronal · 0.71mm/px · 3 of 77 slices shown]
[im 26/77  soft-tissue]
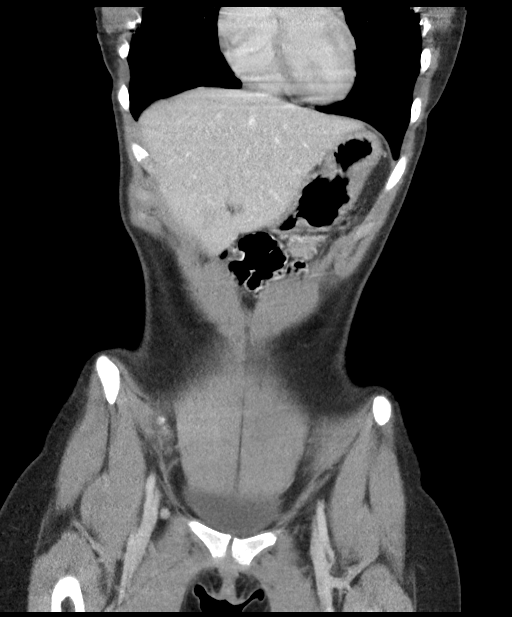
[im 34/77  soft-tissue]
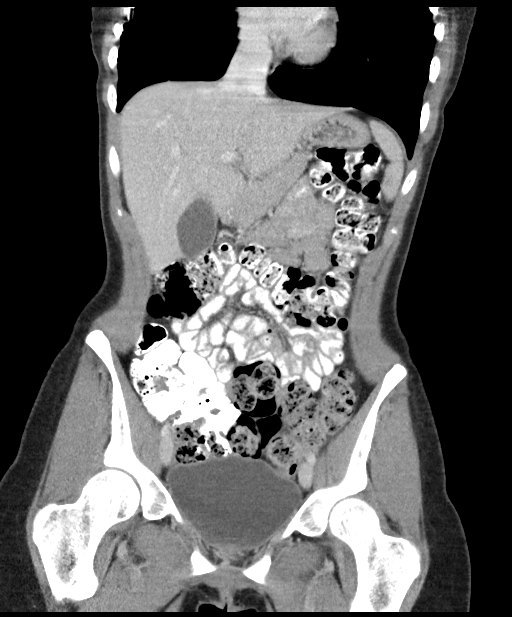
[im 43/77  soft-tissue]
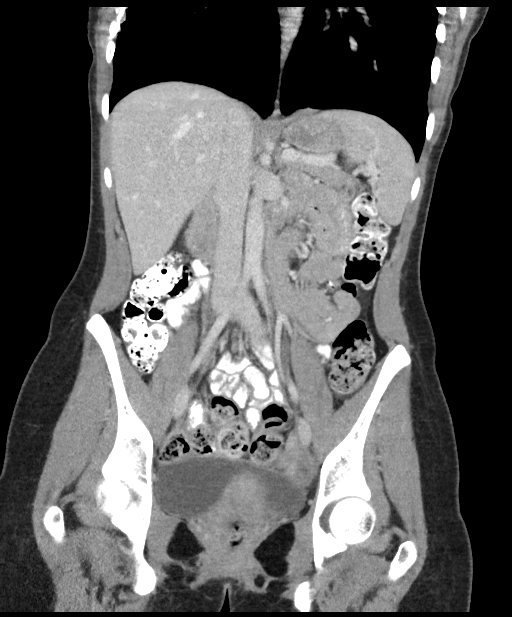

[16 of 46 positions shown; findings below may reference images not displayed]

FINDINGS: Lower chest: Unremarkable

Hepatobiliary: Unremarkable

Pancreas: Unremarkable

Spleen: Unremarkable

Adrenals/Urinary Tract: 8 mm hypodense lesion in the right kidney
lower pole on image 37/2 is likely a cyst although technically too
small to characterize. The adrenal glands appear normal. Urinary
bladder unremarkable.

Stomach/Bowel: A small segment of the appendix is seen for example
on 46 of series 4 and appears unremarkable. No obvious right lower
quadrant inflammatory process. No discrete abnormality of the
terminal ileum.

Prominent stool throughout the colon favors constipation.

Vascular/Lymphatic: Unremarkable

Reproductive: Retroflexed uterus. The ovaries appear unremarkable
for age.

Other: No supplemental non-categorized findings.

Musculoskeletal: There is 6 lumbar type non-rib-bearing vertebra,
with a somewhat small intervertebral disc between the lowest of
these and the rest of the sacrum. This likely represents a segmental
S1 vertebra.
IMPRESSION: 1. Prominent stool throughout the colon favors constipation.
2. 8 mm hypodense lesion in the right kidney lower pole is likely a
cyst although technically too small to characterize.

## 2021-10-26 ENCOUNTER — Encounter (HOSPITAL_COMMUNITY): Payer: Self-pay | Admitting: Emergency Medicine

## 2021-10-26 ENCOUNTER — Emergency Department (HOSPITAL_COMMUNITY)
Admission: EM | Admit: 2021-10-26 | Discharge: 2021-10-27 | Disposition: A | Discharge status: Home / Self Care | Payer: BC Managed Care – PPO | Attending: Emergency Medicine | Admitting: Emergency Medicine

## 2021-10-26 DIAGNOSIS — R000 Tachycardia, unspecified: Secondary | ICD-10-CM | POA: Diagnosis not present

## 2021-10-26 DIAGNOSIS — Z20822 Contact with and (suspected) exposure to covid-19: Secondary | ICD-10-CM | POA: Diagnosis not present

## 2021-10-26 DIAGNOSIS — F840 Autistic disorder: Secondary | ICD-10-CM | POA: Diagnosis not present

## 2021-10-26 DIAGNOSIS — R55 Syncope and collapse: Secondary | ICD-10-CM

## 2021-10-26 LAB — CBC
HCT: 37.4 % (ref 36.0–46.0)
Hemoglobin: 12.5 g/dL (ref 12.0–15.0)
MCH: 31 pg (ref 26.0–34.0)
MCHC: 33.4 g/dL (ref 30.0–36.0)
MCV: 92.8 fL (ref 80.0–100.0)
Platelets: 202 10*3/uL (ref 150–400)
RBC: 4.03 MIL/uL (ref 3.87–5.11)
RDW: 12.2 % (ref 11.5–15.5)
WBC: 5.8 10*3/uL (ref 4.0–10.5)
nRBC: 0 % (ref 0.0–0.2)

## 2021-10-26 LAB — CBG MONITORING, ED
Glucose-Capillary: 148 mg/dL — ABNORMAL HIGH (ref 70–99)
Glucose-Capillary: 122 mg/dL — ABNORMAL HIGH (ref 70–99)

## 2021-10-26 LAB — BASIC METABOLIC PANEL
Anion gap: 9 (ref 5–15)
BUN: 9 mg/dL (ref 6–20)
CO2: 20 mmol/L — ABNORMAL LOW (ref 22–32)
Calcium: 8.7 mg/dL — ABNORMAL LOW (ref 8.9–10.3)
Chloride: 105 mmol/L (ref 98–111)
Creatinine, Ser: 0.6 mg/dL (ref 0.44–1.00)
GFR, Estimated: >60 mL/min (ref >60)
Glucose, Bld: 171 mg/dL — ABNORMAL HIGH (ref 70–99)
Potassium: 3.7 mmol/L (ref 3.5–5.1)
Sodium: 134 mmol/L — ABNORMAL LOW (ref 135–145)

## 2021-10-26 LAB — RESP PANEL BY RT-PCR (FLU A&B, COVID) ARPGX2
Influenza A by PCR: NEGATIVE
Influenza B by PCR: NEGATIVE
SARS Coronavirus 2 by RT PCR: NEGATIVE

## 2021-10-26 NOTE — ED Triage Notes (Signed)
Pt arrives via EMS from home. States she woke up around 5 pm with generalized pain then began to have lightheadedness when walking down the stairs. Reports decreased lack of appetite past few days

## 2021-10-26 NOTE — ED Provider Notes (Signed)
Emergency Medicine Provider Triage Evaluation Note  Teresa Burke , a 20 y.o. female  was evaluated in triage.  Pt complains of lightheadedness, feeling shaky, nausea.  She states that she had generalized pain when she woke up but this is now better.  She has been eating and drinking less than usual.  No vomiting or diarrhea.  No treatments prior to arrival.  Review of Systems  Positive: Malaise, lightheadedness Negative: Chest pain, shortness of breath, fever  Physical Exam  BP 120/76 (BP Location: Right Arm)    Pulse (!) 118    Temp 99 F (37.2 C) (Oral)    Resp 16    Ht 5\' 3"  (1.6 m)    Wt 52.2 kg    SpO2 99%    BMI 20.37 kg/m  Gen:   Awake, no distress   Resp:  Normal effort  MSK:   Moves extremities without difficulty  Other:    Medical Decision Making  Medically screening exam initiated at 6:47 PM.  Appropriate orders placed.  Teresa Burke was informed that the remainder of the evaluation will be completed by another provider, this initial triage assessment does not replace that evaluation, and the importance of remaining in the ED until their evaluation is complete.     Nedra Hai, PA-C 10/26/21 1848    10/28/21, DO 10/26/21 2227

## 2021-10-27 LAB — URINALYSIS, ROUTINE W REFLEX MICROSCOPIC
Bacteria, UA: NONE SEEN
Bilirubin Urine: NEGATIVE
Glucose, UA: 50 mg/dL — AB
Hgb urine dipstick: NEGATIVE
Ketones, ur: 5 mg/dL — AB
Nitrite: NEGATIVE
Protein, ur: NEGATIVE mg/dL
Specific Gravity, Urine: 1.009 (ref 1.005–1.030)
pH: 6 (ref 5.0–8.0)

## 2021-10-27 LAB — PREGNANCY, URINE: Preg Test, Ur: NEGATIVE

## 2021-10-27 NOTE — ED Provider Notes (Signed)
[facility] EMERGENCY DEPARTMENT Provider Note   CSN: [id number] Arrival date & time: 10/26/21  1832     History Chief Complaint  Patient presents with   Near Syncope    Teresa Burke is a 20 y.o. female who presents with her mother at the bedside for concern for episode of severe lightheadedness, tremulousness and nausea.  She states that she gets recurrent migraines and has had a migraine for the last few days.  Has been sleeping for several hours and eaten very little in the last 24 hours prior to onset of her symptoms.  When she woke up at 5 PM she was experiencing the symptoms and felt extremely lightheaded when coming down the stairs after using the restroom.  Her mother who is at bedside states that she began to become very anxious after that she was "hardly able to breathe"the both the patient and her mother suspect this may have been contributed to by her anxiety.  Of note patient's parents did encourage her to drink a can of soda immediately following this episode with improvement in her symptoms.  Her mother states that initially she seemed confused, delayed in her thinking and this seems to have improved after having drank some soda.  I personally reviewed her medical records.  She does have a history of bipolar disorder, migraines, and anxiety.  She is not taking any medications daily as she "cannot tolerate them due to side effects". HPI     Past Medical History:  Diagnosis Date   Anxiety    Migraine    Otitis     Patient Active Problem List   Diagnosis Date Noted   Autism 08/04/2017   ADD (attention deficit disorder) 08/04/2017   Visual disturbances 01/20/2015   Migraine without aura and without status migrainosus, not intractable 01/20/2015   Generalized anxiety disorder 01/20/2015   Bipolar 2 disorder (HCC) 01/20/2015    Past Surgical History:  Procedure Laterality Date   TYMPANOSTOMY TUBE PLACEMENT       OB History   No obstetric  history on file.     Family History  Problem Relation Age of Onset   Migraines Mother    Depression Mother    Anxiety disorder Mother    Migraines Father    Anxiety disorder Father    Depression Father     Social History   Tobacco Use   Smoking status: Never   Smokeless tobacco: Never  Vaping Use   Vaping Use: Never used  Substance Use Topics   Alcohol use: No   Drug use: No    Home Medications Prior to Admission medications   Medication Sig Start Date End Date Taking? Authorizing Provider  loratadine (CLARITIN) 10 MG tablet Take 10 mg by mouth daily.    [provider]  naratriptan (AMERGE) 2.5 MG tablet Take 1 tablet (2.5 mg total) by mouth as needed for migraine. Take one (1) tablet at onset of headache; if returns or does not resolve, may repeat after 4 hours; do not exceed five (5) mg in 24 hours. 07/05/20   Anson Fret, MD  nortriptyline (PAMELOR) 10 MG capsule TAKE 1 CAPSULE (10 MG TOTAL) BY MOUTH AT BEDTIME. 02/22/20   Anson Fret, MD  VITAMIN D PO Take 5,000 Int'l Units by mouth daily.    [provider]    Allergies    Mosquito (culex pipiens) allergy skin test, Fremanezumab-vfrm, and Other  Review of Systems   Review of Systems  Constitutional:  Positive for activity change, appetite change and fatigue. Negative for chills and fever.  HENT: Negative.  Negative for congestion and sore throat.   Eyes: Negative.   Respiratory: Negative.    Cardiovascular: Negative.   Gastrointestinal:  Positive for nausea. Negative for blood in stool, constipation, diarrhea and vomiting.  Genitourinary: Negative.   Musculoskeletal: Negative.   Skin: Negative.   Neurological:  Positive for light-headedness and headaches. Negative for syncope and weakness.  Hematological: Negative.   Psychiatric/Behavioral: Negative.     Physical Exam Updated Vital Signs BP 121/66 (BP Location: Right Arm)    Pulse (!) 108    Temp 99.1 F (37.3 C) (Oral)    Resp  20    Ht 5\' 3"  (1.6 m)    Wt 52.2 kg    SpO2 100%    BMI 20.37 kg/m   Physical Exam Vitals and nursing note reviewed.  Constitutional:      Appearance: She is not ill-appearing or toxic-appearing.  HENT:     Head: Normocephalic and atraumatic.     Nose: Nose normal.     Mouth/Throat:     Mouth: Mucous membranes are dry.     Pharynx: Oropharynx is clear. Uvula midline. No oropharyngeal exudate or posterior oropharyngeal erythema.     Tonsils: No tonsillar exudate.  Eyes:     General: Lids are normal. Vision grossly intact.        Right eye: No discharge.        Left eye: No discharge.     Extraocular Movements: Extraocular movements intact.     Conjunctiva/sclera: Conjunctivae normal.     Pupils: Pupils are equal, round, and reactive to light.  Neck:     Trachea: Trachea and phonation normal.  Cardiovascular:     Rate and Rhythm: Regular rhythm. Tachycardia present.     Pulses: Normal pulses.     Heart sounds: Normal heart sounds. No murmur heard.    Comments: Mild tachycardia with HR of 106 at time of evaluation.  Pulmonary:     Effort: Pulmonary effort is normal. No tachypnea, bradypnea, accessory muscle usage, prolonged expiration or respiratory distress.     Breath sounds: Normal breath sounds. No wheezing or rales.  Chest:     Chest wall: No mass, lacerations, deformity, swelling, tenderness, crepitus or edema.  Abdominal:     General: Bowel sounds are normal. There is no distension.     Palpations: Abdomen is soft.     Tenderness: There is no abdominal tenderness. There is no right CVA tenderness, left CVA tenderness, guarding or rebound.  Musculoskeletal:        General: No deformity.     Cervical back: Normal range of motion and neck supple. No edema, rigidity or crepitus. No pain with movement, spinous process tenderness or muscular tenderness.     Right lower leg: No edema.     Left lower leg: No edema.  Lymphadenopathy:     Cervical: No cervical adenopathy.   Skin:    General: Skin is warm and dry.     Capillary Refill: Capillary refill takes less than 2 seconds.  Neurological:     General: No focal deficit present.     Mental Status: She is alert and oriented to person, place, and time. Mental status is at baseline.     GCS: GCS eye subscore is 4. GCS verbal subscore is 5. GCS motor subscore is 6.     Cranial Nerves: Cranial nerves 2-12 are intact.  Sensory: Sensation is intact.     Motor: Motor function is intact.     Coordination: Coordination is intact.     Gait: Gait is intact.  Psychiatric:        Mood and Affect: Mood is anxious.    ED Results / Procedures / Treatments   Labs (all labs ordered are listed, but only abnormal results are displayed) Labs Reviewed  BASIC METABOLIC PANEL - Abnormal; Notable for the following components:      Result Value   Sodium 134 (*)    CO2 20 (*)    Glucose, Bld 171 (*)    Calcium 8.7 (*)    All other components within normal limits  URINALYSIS, ROUTINE W REFLEX MICROSCOPIC - Abnormal; Notable for the following components:   Color, Urine STRAW (*)    Glucose, UA 50 (*)    Ketones, ur 5 (*)    Leukocytes,Ua TRACE (*)    All other components within normal limits  CBG MONITORING, ED - Abnormal; Notable for the following components:   Glucose-Capillary 148 (*)    All other components within normal limits  CBG MONITORING, ED - Abnormal; Notable for the following components:   Glucose-Capillary 122 (*)    All other components within normal limits  RESP PANEL BY RT-PCR (FLU A&B, COVID) ARPGX2  CBC  PREGNANCY, URINE  I-STAT BETA HCG BLOOD, ED ([facility], [facility], [facility] ONLY)    EKG EKG Interpretation  Date/Time:  Wednesday October 26 2021 18:38:27 EST Ventricular Rate:  109 PR Interval:  126 QRS Duration: 64 QT Interval:  308 QTC Calculation: 414 R Axis:   70 Text Interpretation: Sinus tachycardia Otherwise normal ECG No old tracing to compare Confirmed by Dione Booze ([id number]) on 10/26/2021  11:09:26 PM  Radiology No results found.  Procedures Procedures   Medications Ordered in ED Medications - No data to display  ED Course  I have reviewed the triage vital signs and the nursing notes.  Pertinent labs & imaging results that were available during my care of the patient were reviewed by me and considered in my medical decision making (see chart for details).    MDM Rules/Calculators/A&P                         20 year old female presents with concern for lightheadedness, tremulousness, and anxiety.  Tachycardic to the 1 teens on intake, vital signs otherwise normal.  Cardiopulmonary exam reveals very mild tachycardia with heart rate of 106 at time of my evaluation.  Pulmonary exam is normal and abdominal exam is benign.  HEENT exam revealed dry mucous membranes but otherwise is unremarkable.  Patient is ANO x4 at this time, neurologically and vascularly intact in all 4 extremities.  Basic labs were obtained from triage prior to my evaluation.  CBC is unremarkable, BMP with mild hyponatremia of 134, hyperglycemia of 171.  Patient is not pregnant and UA is without sign of infection.  EKG with sinus tachycardia but otherwise unremarkable.  Given persistent mild tachycardia and clinical concern for mild dehydration IV fluid resuscitation was offered, however patient refused stating that she would like to be discharged home at this time having been in the emergency department for over 9 hours.  Educated the patient on oral rehydration therapy.  Suspect possible episode of hypoglycemia may have contributed to her symptoms earlier as well as mild dehydration.  Suspect patient's ongoing anxiety and underlying medical conditions with secondary anorexia is contributing to her dehydration  as well.  This was discussed with the patient who voiced understanding.  No further work-up warranted in the ER at this time.  Teresa Burke voiced understanding of her medical evaluation and treatment plan.   Each of her questions was answered to her expressed satisfaction.  Strict return precautions were given.  Patient is stable and appropriate for discharge at this time.  This chart was dictated using voice recognition software, Dragon. Despite the best efforts of this provider to proofread and correct errors, errors may still occur which can change documentation meaning.   Final Clinical Impression(s) / ED Diagnoses Final diagnoses:  Near syncope    Rx / DC Orders ED Discharge Orders     None        Sherrilee Gilles 10/27/21 0503    Dione Booze, MD 10/27/21 [phone]

## 2021-10-27 NOTE — Discharge Instructions (Signed)
You were seen in the ER today for your episode of lightheadedness.  It appears you may be dehydrated.  You were offered IV fluids in the emergency, but you expressed wishes to proceed with oral rehydration at home.  It is exceedingly important that you do continue to hydrate yourself for the next several days incorporating electrolyte drinks.  Please make sure that you are eating regularly throughout the day to avoid episodes of low blood sugar.  Below is a condition patient with a cardiologist with whom you may follow-up in the outpatient setting.  Please return to the ER if you develop any recurrent near syncope, difficulty breathing, nausea or vomiting that does not stop, confusion, or any other new severe symptoms.

## 2021-11-03 ENCOUNTER — Other Ambulatory Visit: Payer: Self-pay

## 2021-11-03 ENCOUNTER — Emergency Department (HOSPITAL_COMMUNITY)
Admission: EM | Admit: 2021-11-03 | Discharge: 2021-11-03 | Disposition: A | Discharge status: Home / Self Care | Payer: BC Managed Care – PPO | Attending: Emergency Medicine | Admitting: Emergency Medicine

## 2021-11-03 ENCOUNTER — Encounter (HOSPITAL_COMMUNITY): Payer: Self-pay | Admitting: Emergency Medicine

## 2021-11-03 DIAGNOSIS — I951 Orthostatic hypotension: Secondary | ICD-10-CM | POA: Diagnosis not present

## 2021-11-03 DIAGNOSIS — F840 Autistic disorder: Secondary | ICD-10-CM | POA: Diagnosis not present

## 2021-11-03 DIAGNOSIS — E876 Hypokalemia: Secondary | ICD-10-CM

## 2021-11-03 DIAGNOSIS — N9489 Other specified conditions associated with female genital organs and menstrual cycle: Secondary | ICD-10-CM | POA: Insufficient documentation

## 2021-11-03 DIAGNOSIS — R42 Dizziness and giddiness: Secondary | ICD-10-CM

## 2021-11-03 LAB — BASIC METABOLIC PANEL
Anion gap: 11 (ref 5–15)
BUN: 9 mg/dL (ref 6–20)
CO2: 19 mmol/L — ABNORMAL LOW (ref 22–32)
Calcium: 9.2 mg/dL (ref 8.9–10.3)
Chloride: 105 mmol/L (ref 98–111)
Creatinine, Ser: 0.58 mg/dL (ref 0.44–1.00)
GFR, Estimated: >60 mL/min (ref >60)
Glucose, Bld: 125 mg/dL — ABNORMAL HIGH (ref 70–99)
Potassium: 3.3 mmol/L — ABNORMAL LOW (ref 3.5–5.1)
Sodium: 135 mmol/L (ref 135–145)

## 2021-11-03 LAB — CBC
HCT: 35.7 % — ABNORMAL LOW (ref 36.0–46.0)
Hemoglobin: 12.2 g/dL (ref 12.0–15.0)
MCH: 31 pg (ref 26.0–34.0)
MCHC: 34.2 g/dL (ref 30.0–36.0)
MCV: 90.8 fL (ref 80.0–100.0)
Platelets: 213 10*3/uL (ref 150–400)
RBC: 3.93 MIL/uL (ref 3.87–5.11)
RDW: 12.3 % (ref 11.5–15.5)
WBC: 7 10*3/uL (ref 4.0–10.5)
nRBC: 0 % (ref 0.0–0.2)

## 2021-11-03 LAB — I-STAT BETA HCG BLOOD, ED (MC, WL, AP ONLY): I-stat hCG, quantitative: <5 m[IU]/mL (ref <5)

## 2021-11-03 LAB — TSH: TSH: 1.38 u[IU]/mL (ref 0.350–4.500)

## 2021-11-03 MED ORDER — SODIUM CHLORIDE 0.9 % IV BOLUS
1000.0000 mL | Freq: Once | INTRAVENOUS | Status: AC
  Administered 2021-11-03: 17:00:00 1000 mL via INTRAVENOUS

## 2021-11-03 MED ORDER — SODIUM CHLORIDE 0.9 % IV BOLUS
1000.0000 mL | Freq: Once | INTRAVENOUS | Status: AC
  Administered 2021-11-03: 18:00:00 1000 mL via INTRAVENOUS

## 2021-11-03 NOTE — ED Triage Notes (Addendum)
Pt to triage via [facility] from home.  C/o lightheadedness/dizziness when standing and walking x 1 hr.  Seen in ED last week for same.  EMS reports anxiety.  Resting HR 100.  Standing 130.  BP unchanged with movement.  Mild abd pain- on her menstrual cycle.  BP 118/70 CBG 122

## 2021-11-03 NOTE — ED Notes (Signed)
Pt father states pt has a mental disability and that she needs to use bathroom. Pt father stated that he did not think she would use the bathroom here or let any personnel help her. Pt and father advised that they could run home and use restroom at their own discretion but if her name was called and they were not here, we would not hold the bed for them. Pt and father expressed understanding and are going home to use restroom and come back.

## 2021-11-03 NOTE — ED Notes (Signed)
Pt states she is unable to urinate at this time. States she just had a UA sample last time she was here and with same symptoms.

## 2021-11-03 NOTE — ED Notes (Signed)
Pt given graham crackers and water.

## 2021-11-03 NOTE — ED Provider Notes (Addendum)
Emergency Medicine Provider Triage Evaluation Note  Teresa Burke , a 20 y.o. female  was evaluated in triage.  Pt complains of shakiness and lightheadedness for about 30 minutes prior to arrival.  Patient states that she feels lightheaded when she is standing or walking, and it slightly resolves when she is sitting.  She is complaining of anxiety, but states never been this bad before.  She was treated last week for similar symptoms, was given IV fluids and sent home.  She said that she was feeling better at home, and had recurrence this morning.  She is complaining of some mild abdominal pain, but is on her menstrual cycle right now.  EMS reported she had an increase in her heart rate from 100 bpm to 130 when standing, blood pressure remained normal.  She says that she has been hydrating normally.  Review of Systems  Positive: Lightheadedness, dizziness, abdominal pain, anxiety Negative: Chest pain, shortness of breath   Physical Exam  BP 136/70 (BP Location: Right Arm)    Pulse 97    Temp 98.8 F (37.1 C) (Oral)    Resp (!) 24    SpO2 100%  Gen:   Awake, no distress   Resp:  Normal effort  MSK:   Moves extremities without difficulty  Other:  Patient appears very anxious  Medical Decision Making  Medically screening exam initiated at 10:21 AM.  Appropriate orders placed.  Teresa Burke was informed that the remainder of the evaluation will be completed by another provider, this initial triage assessment does not replace that evaluation, and the importance of remaining in the ED until their evaluation is complete.     Jeanella Flattery 11/03/21 1021  Staff in lobby states father is with patient now and can give better history once she is roomed.     Jeanella Flattery 11/03/21 1038    Terald Sleeper, MD 11/03/21 1425

## 2021-11-03 NOTE — ED Notes (Signed)
Pt and father returned from

## 2021-11-03 NOTE — ED Provider Notes (Signed)
[facility] EMERGENCY DEPARTMENT Provider Note   CSN: [id number] Arrival date & time: 11/03/21  1005     History No chief complaint on file.   Teresa Burke is a 20 y.o. female.  Patient presents the emergency department for lightheadedness and anxiety.  Patient states that she thinks she was sitting outside with her dog and stood up and was walking downstairs in her house.  She began to feel acutely shaky and lightheaded.  No full syncope.  No associated chest pain or shortness of breath.  Symptoms lasted about 30 minutes.  She denies any recent nausea or vomiting.  She has had similar episodes recently and was seen in the emergency department about a week ago.  Has not yet follow-up with her PCP about this.  Does report history of anxiety.  She is intolerant to a lot of different medications and is not currently on anything for this.  No family history of cardiac disease or arrhythmia.      Past Medical History:  Diagnosis Date   Anxiety    Migraine    Otitis     Patient Active Problem List   Diagnosis Date Noted   Autism 08/04/2017   ADD (attention deficit disorder) 08/04/2017   Visual disturbances 01/20/2015   Migraine without aura and without status migrainosus, not intractable 01/20/2015   Generalized anxiety disorder 01/20/2015   Bipolar 2 disorder (HCC) 01/20/2015    Past Surgical History:  Procedure Laterality Date   TYMPANOSTOMY TUBE PLACEMENT       OB History   No obstetric history on file.     Family History  Problem Relation Age of Onset   Migraines Mother    Depression Mother    Anxiety disorder Mother    Migraines Father    Anxiety disorder Father    Depression Father     Social History   Tobacco Use   Smoking status: Never   Smokeless tobacco: Never  Vaping Use   Vaping Use: Never used  Substance Use Topics   Alcohol use: No   Drug use: No    Home Medications Prior to Admission medications   Medication Sig  Start Date End Date Taking? Authorizing Provider  loratadine (CLARITIN) 10 MG tablet Take 10 mg by mouth daily.    [provider]  naratriptan (AMERGE) 2.5 MG tablet Take 1 tablet (2.5 mg total) by mouth as needed for migraine. Take one (1) tablet at onset of headache; if returns or does not resolve, may repeat after 4 hours; do not exceed five (5) mg in 24 hours. 07/05/20   Anson Fret, MD  nortriptyline (PAMELOR) 10 MG capsule TAKE 1 CAPSULE (10 MG TOTAL) BY MOUTH AT BEDTIME. 02/22/20   Anson Fret, MD  VITAMIN D PO Take 5,000 Int'l Units by mouth daily.    [provider]    Allergies    Mosquito (culex pipiens) allergy skin test, Fremanezumab-vfrm, and Other  Review of Systems   Review of Systems  Constitutional:  Negative for fever.  HENT:  Negative for rhinorrhea and sore throat.   Eyes:  Negative for redness.  Respiratory:  Negative for cough.   Cardiovascular:  Negative for chest pain.  Gastrointestinal:  Negative for abdominal pain, diarrhea, nausea and vomiting.  Genitourinary:  Negative for dysuria, frequency, hematuria and urgency.  Musculoskeletal:  Negative for myalgias.  Skin:  Negative for rash.  Neurological:  Positive for dizziness, tremors (Shakiness) and light-headedness. Negative for headaches.  Psychiatric/Behavioral:  The patient is nervous/anxious.    Physical Exam Updated Vital Signs BP 111/65 (BP Location: Right Arm)    Pulse 83    Temp 98.5 F (36.9 C)    Resp 14    LMP 11/02/2021    SpO2 100%   Physical Exam Vitals and nursing note reviewed.  Constitutional:      General: She is not in acute distress.    Appearance: She is well-developed. She is not diaphoretic.  HENT:     Head: Normocephalic and atraumatic.     Right Ear: External ear normal.     Left Ear: External ear normal.     Nose: Nose normal.     Mouth/Throat:     Mouth: Mucous membranes are not dry.  Eyes:     Conjunctiva/sclera: Conjunctivae normal.  Neck:      Vascular: Normal carotid pulses. No JVD.     Trachea: Trachea normal. No tracheal deviation.  Cardiovascular:     Rate and Rhythm: Normal rate and regular rhythm.     Pulses: No decreased pulses.          Radial pulses are 2+ on the right side and 2+ on the left side.     Heart sounds: Normal heart sounds, S1 normal and S2 normal. No murmur heard. Pulmonary:     Effort: Pulmonary effort is normal. No respiratory distress.     Breath sounds: No wheezing, rhonchi or rales.  Chest:     Chest wall: No tenderness.  Abdominal:     General: Bowel sounds are normal.     Palpations: Abdomen is soft.     Tenderness: There is no abdominal tenderness. There is no guarding or rebound.  Musculoskeletal:        General: Normal range of motion.     Cervical back: Normal range of motion and neck supple. No muscular tenderness.     Right lower leg: No edema.     Left lower leg: No edema.  Skin:    General: Skin is warm and dry.     Coloration: Skin is not pale.     Findings: No rash.  Neurological:     General: No focal deficit present.     Mental Status: She is alert. Mental status is at baseline.     Motor: No weakness.  Psychiatric:        Mood and Affect: Mood normal.    ED Results / Procedures / Treatments   Labs (all labs ordered are listed, but only abnormal results are displayed) Labs Reviewed  BASIC METABOLIC PANEL - Abnormal; Notable for the following components:      Result Value   Potassium 3.3 (*)    CO2 19 (*)    Glucose, Bld 125 (*)    All other components within normal limits  CBC - Abnormal; Notable for the following components:   HCT 35.7 (*)    All other components within normal limits  TSH  CBG MONITORING, ED  I-STAT BETA HCG BLOOD, ED ([facility], [facility], [facility] ONLY)    EKG None  Radiology No results found.  Procedures Procedures   Medications Ordered in ED Medications  sodium chloride 0.9 % bolus 1,000 mL (0 mLs Intravenous Stopped 11/03/21 1901)  sodium chloride  0.9 % bolus 1,000 mL (0 mLs Intravenous Stopped 11/03/21 1901)    ED Course  I have reviewed the triage vital signs and the nursing notes.  Pertinent labs & imaging results that were available  during my care of the patient were reviewed by me and considered in my medical decision making (see chart for details).  Patient seen and examined. Plan discussed with patient and father at bedside.  Labs: Labs reviewed.  Mild hypokalemia.  TSH added.  Imaging: None indicated  Medications/Fluids: IV fluid bolus  Vital signs reviewed and are as follows: BP 111/65 (BP Location: Right Arm)    Pulse 83    Temp 98.5 F (36.9 C)    Resp 14    LMP 11/02/2021    SpO2 100%   Initial impression: Lightheadedness/near syncope, anxiety  Will check orthostatic vital signs.  Will have patient follow-up with PCP discussed possible treatment options.  7:27 PM updated patient and family on results.  Patient has received 2 L IV normal saline.  She is feeling a bit better.  She is apprehensive about using the restroom in ED to provide urine sample.  Will defer this at the current time.  Patient states that she feels like she has to use the restroom.  Suspect that she is having normal urinary output.  Plan for discharged home, PCP and OB/GYN follow-up.  TSH will need to be followed up upon.  Otherwise, encouraged good hydration at home including oral intake, follow-up, return if worsening, additional episodes of syncope or other problems.      MDM Rules/Calculators/A&P                          Patient with lightheadedness and syncope, seems orthostatic in nature.  This has been a recurrent problem for her lately.  Work-up here today is overall reassuring.  Mild hypokalemia, no anemia or signs of infection.  EKG is normal.  No signs of prolonged QT, heart block, without a syndrome, WPW, or other arrhythmia.  No concerning family history.  She is stable during ED stay and is improved after hydration.  Plan for  discharged home with outpatient follow-up.       Final Clinical Impression(s) / ED Diagnoses Final diagnoses:  Orthostatic lightheadedness  Hypokalemia    Rx / DC Orders ED Discharge Orders     None        Carlisle Cater, PA-C 11/03/21 1930    Drenda Freeze, MD 11/04/21 [phone]

## 2021-11-03 NOTE — ED Notes (Addendum)
Orthostatic VS Lying- BP: 124/74 HR 88 Sitting- BP: 122/79  HR 115 Standing- BP: 143/78 HR 115  Pt c/o "feeling faint" when going from lying to sitting position and reports she felt worse when standing.

## 2021-11-03 NOTE — Discharge Instructions (Addendum)
Please read and follow all provided instructions.  Your diagnoses today include:  1. Orthostatic lightheadedness    Tests performed today include: EKG: was normal Complete blood cell count: no problems Basic metabolic panel: slightly low potassium, otherwise no significant problems Pregnancy test (urine or blood, in women only): negative Thyroid testing: Pending, results should be back by tomorrow, please have your doctor check this Vital signs. See below for your results today.   Medications prescribed:  None  Take any prescribed medications only as directed.  Home care instructions:  Follow any educational materials contained in this packet.  BE VERY CAREFUL not to take multiple medicines containing Tylenol (also called acetaminophen). Doing so can lead to an overdose which can damage your liver and cause liver failure and possibly death.   Follow-up instructions: Please follow-up with your primary care provider in the next 3 days for further evaluation of your symptoms.   Return instructions:  Please return to the Emergency Department if you experience worsening symptoms.  Please return if you have any other emergent concerns.  Additional Information:  Your vital signs today were: BP 111/65 (BP Location: Right Arm)    Pulse 83    Temp 98.5 F (36.9 C)    Resp 14    LMP 11/02/2021    SpO2 100%  If your blood pressure (BP) was elevated above 135/85 this visit, please have this repeated by your doctor within one month. --------------

## 2021-11-28 ENCOUNTER — Emergency Department (HOSPITAL_COMMUNITY)
Admission: EM | Admit: 2021-11-28 | Discharge: 2021-11-29 | Disposition: A | Discharge status: Home / Self Care | Payer: BC Managed Care – PPO | Attending: Student | Admitting: Student

## 2021-11-28 ENCOUNTER — Encounter (HOSPITAL_COMMUNITY): Payer: Self-pay

## 2021-11-28 DIAGNOSIS — F419 Anxiety disorder, unspecified: Secondary | ICD-10-CM | POA: Insufficient documentation

## 2021-11-28 DIAGNOSIS — E611 Iron deficiency: Secondary | ICD-10-CM | POA: Diagnosis not present

## 2021-11-28 DIAGNOSIS — Z5321 Procedure and treatment not carried out due to patient leaving prior to being seen by health care provider: Secondary | ICD-10-CM | POA: Insufficient documentation

## 2021-11-28 DIAGNOSIS — I10 Essential (primary) hypertension: Secondary | ICD-10-CM | POA: Insufficient documentation

## 2021-11-28 LAB — BASIC METABOLIC PANEL
Anion gap: 9 (ref 5–15)
BUN: 11 mg/dL (ref 6–20)
CO2: 21 mmol/L — ABNORMAL LOW (ref 22–32)
Calcium: 9.5 mg/dL (ref 8.9–10.3)
Chloride: 107 mmol/L (ref 98–111)
Creatinine, Ser: 0.71 mg/dL (ref 0.44–1.00)
GFR, Estimated: >60 mL/min (ref >60)
Glucose, Bld: 101 mg/dL — ABNORMAL HIGH (ref 70–99)
Potassium: 3.8 mmol/L (ref 3.5–5.1)
Sodium: 137 mmol/L (ref 135–145)

## 2021-11-28 LAB — CBC WITH DIFFERENTIAL/PLATELET
Abs Immature Granulocytes: 0.01 10*3/uL (ref 0.00–0.07)
Basophils Absolute: 0 10*3/uL (ref 0.0–0.1)
Basophils Relative: 1 %
Eosinophils Absolute: 0 10*3/uL (ref 0.0–0.5)
Eosinophils Relative: 1 %
HCT: 36.2 % (ref 36.0–46.0)
Hemoglobin: 11.9 g/dL — ABNORMAL LOW (ref 12.0–15.0)
Immature Granulocytes: 0 %
Lymphocytes Relative: 30 %
Lymphs Abs: 1.3 10*3/uL (ref 0.7–4.0)
MCH: 30.3 pg (ref 26.0–34.0)
MCHC: 32.9 g/dL (ref 30.0–36.0)
MCV: 92.1 fL (ref 80.0–100.0)
Monocytes Absolute: 0.4 10*3/uL (ref 0.1–1.0)
Monocytes Relative: 10 %
Neutro Abs: 2.5 10*3/uL (ref 1.7–7.7)
Neutrophils Relative %: 58 %
Platelets: 239 10*3/uL (ref 150–400)
RBC: 3.93 MIL/uL (ref 3.87–5.11)
RDW: 12.5 % (ref 11.5–15.5)
WBC: 4.2 10*3/uL (ref 4.0–10.5)
nRBC: 0 % (ref 0.0–0.2)

## 2021-11-28 LAB — URINALYSIS, ROUTINE W REFLEX MICROSCOPIC
Bacteria, UA: NONE SEEN
Bilirubin Urine: NEGATIVE
Glucose, UA: NEGATIVE mg/dL
Ketones, ur: NEGATIVE mg/dL
Leukocytes,Ua: NEGATIVE
Nitrite: NEGATIVE
Protein, ur: NEGATIVE mg/dL
Specific Gravity, Urine: 1.006 (ref 1.005–1.030)
pH: 7 (ref 5.0–8.0)

## 2021-11-28 NOTE — ED Provider Triage Note (Addendum)
Emergency Medicine Provider Triage Evaluation Note  Teresa Burke , a 21 y.o. female  was evaluated in triage.  Pt complains of feeling like her heart "dropped."  Patient has a history of low iron and ferritin.  Started her menstrual period 2 days ago.  Her mother reports that "this is the first time the iron has affected her heart."  Took a Xanax and an iron supplement prior to arrival.  Review of Systems  Positive: Chest "drop." Negative: Shortness of breath, palpitations  Physical Exam  BP 125/80 (BP Location: Left Arm)    Pulse (!) 118    Temp 98.3 F (36.8 C) (Oral)    Resp 17    Ht 5\' 3"  (1.6 m)    Wt 53 kg    LMP 11/02/2021    SpO2 98%    BMI 20.70 kg/m  Gen:   Awake, no distress   Resp:  Normal effort  MSK:   Moves extremities without difficulty  Other:  Regular rhythm, tachycardic, lung sounds clear  Medical Decision Making  Medically screening exam initiated at 9:38 PM.  Appropriate orders placed.  Bernyce ARACELI WENG was informed that the remainder of the evaluation will be completed by another provider, this initial triage assessment does not replace that evaluation, and the importance of remaining in the ED until their evaluation is complete.   Mother very anxious about patient needing an iron transfusion.   Rhae Hammock, PA-C 11/28/21 2141   Also reports that she was having elevated blood pressure readings over the past weeks.  Normal blood pressure today.   Rhae Hammock, PA-C 11/28/21 2141

## 2021-11-28 NOTE — ED Triage Notes (Signed)
Pt arrives POV for eval of hypertension and "irregular heartbeat" at home. Mother reports she was feeling off earlier today, found to be hypertensive at home. Mildly hypertensive here, tachycardic. Endorses hx of anxiety, states took a xanax prior to arrival

## 2021-11-29 NOTE — ED Notes (Signed)
Patient left.
